# Patient Record
Sex: Male | Born: 1985 | Race: White | Hispanic: No | Marital: Single | Smoking: Former smoker
Health system: Southern US, Community
[De-identification: ages and names within clinical notes are randomized; demographics above are authoritative.]

## PROBLEM LIST (undated history)

## (undated) ENCOUNTER — Emergency Department (HOSPITAL_COMMUNITY): Payer: Self-pay

## (undated) HISTORY — PX: FOOT FUSION: SHX956

---

## 2006-02-23 ENCOUNTER — Emergency Department (HOSPITAL_COMMUNITY): Admission: EM | Admit: 2006-02-23 | Discharge: 2006-02-24 | Payer: Self-pay | Admitting: Emergency Medicine

## 2007-02-28 ENCOUNTER — Emergency Department (HOSPITAL_COMMUNITY): Admission: EM | Admit: 2007-02-28 | Discharge: 2007-03-01 | Payer: Self-pay | Admitting: Emergency Medicine

## 2011-01-04 ENCOUNTER — Emergency Department (HOSPITAL_COMMUNITY)
Admission: EM | Admit: 2011-01-04 | Discharge: 2011-01-04 | Disposition: A | Discharge status: Home / Self Care | Payer: Self-pay | Attending: Emergency Medicine | Admitting: Emergency Medicine

## 2011-01-04 ENCOUNTER — Emergency Department (HOSPITAL_COMMUNITY): Payer: Self-pay

## 2011-01-04 DIAGNOSIS — R071 Chest pain on breathing: Secondary | ICD-10-CM | POA: Insufficient documentation

## 2011-01-04 DIAGNOSIS — K029 Dental caries, unspecified: Secondary | ICD-10-CM | POA: Insufficient documentation

## 2011-01-04 DIAGNOSIS — I10 Essential (primary) hypertension: Secondary | ICD-10-CM | POA: Insufficient documentation

## 2011-01-04 DIAGNOSIS — F411 Generalized anxiety disorder: Secondary | ICD-10-CM | POA: Insufficient documentation

## 2011-01-04 DIAGNOSIS — K089 Disorder of teeth and supporting structures, unspecified: Secondary | ICD-10-CM | POA: Insufficient documentation

## 2011-01-04 LAB — POCT I-STAT, CHEM 8
Calcium, Ion: 1.17 mmol/L (ref 1.12–1.32)
Hemoglobin: 15 g/dL (ref 13.0–17.0)
Potassium: 3.7 mEq/L (ref 3.5–5.1)
Sodium: 141 mEq/L (ref 135–145)
TCO2: 28 mmol/L (ref 0–100)

## 2011-01-04 LAB — POCT CARDIAC MARKERS
Myoglobin, poc: 119 ng/mL (ref 12–200)
Myoglobin, poc: 122 ng/mL (ref 12–200)
Troponin i, poc: <0.05 ng/mL (ref 0.00–0.09)

## 2011-01-04 LAB — CBC
Hemoglobin: 14.7 g/dL (ref 13.0–17.0)
MCH: 31.3 pg (ref 26.0–34.0)
Platelets: 194 10*3/uL (ref 150–400)
RBC: 4.69 MIL/uL (ref 4.22–5.81)
WBC: 11.1 10*3/uL — ABNORMAL HIGH (ref 4.0–10.5)

## 2013-02-27 IMAGING — CR DG CHEST 2V
2 series · 2 of 2 positions shown · non-contrast
Comparison: 02/28/2007

CLINICAL DATA: Chest pain

CHEST - 2 VIEW

[w chest pa]
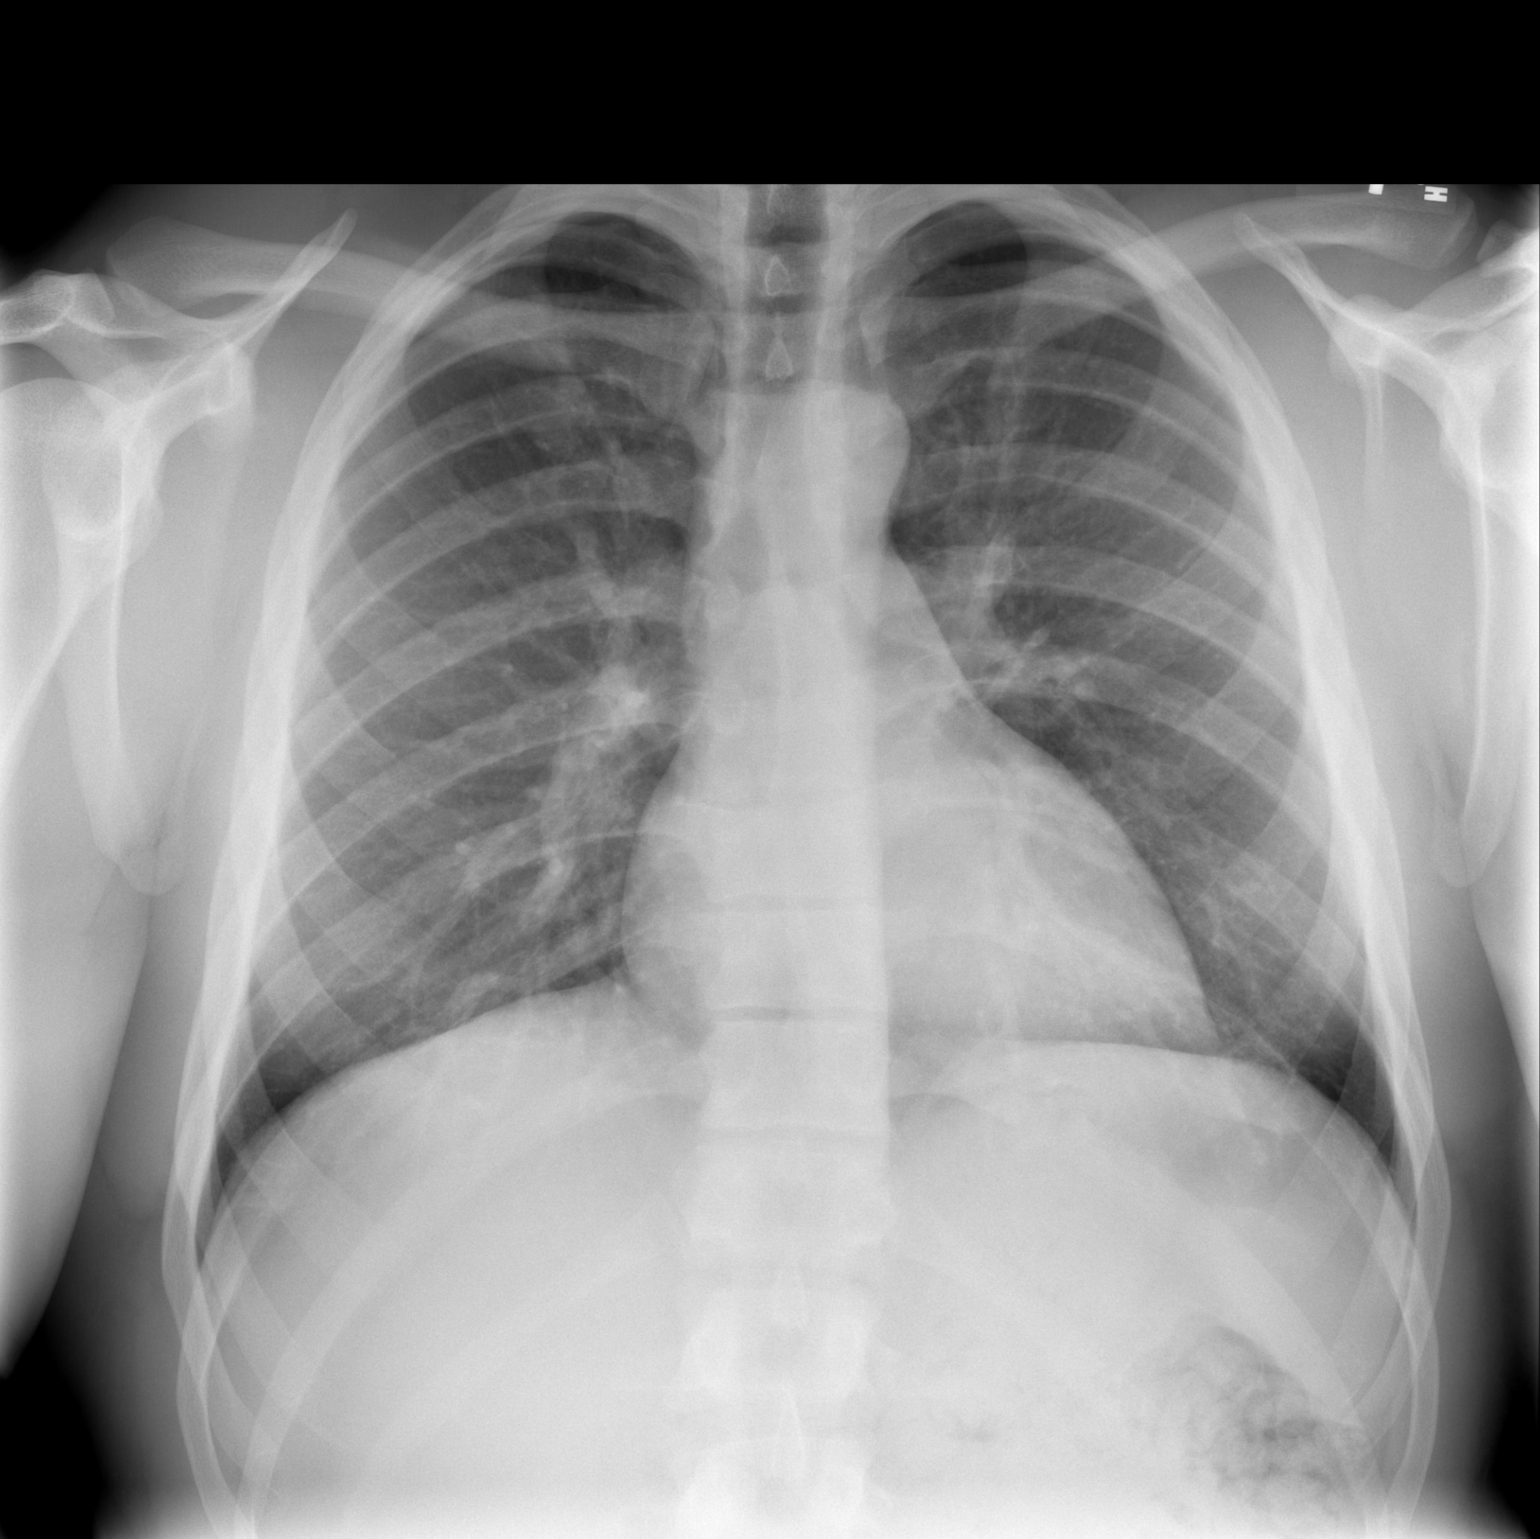

[w chest lat]
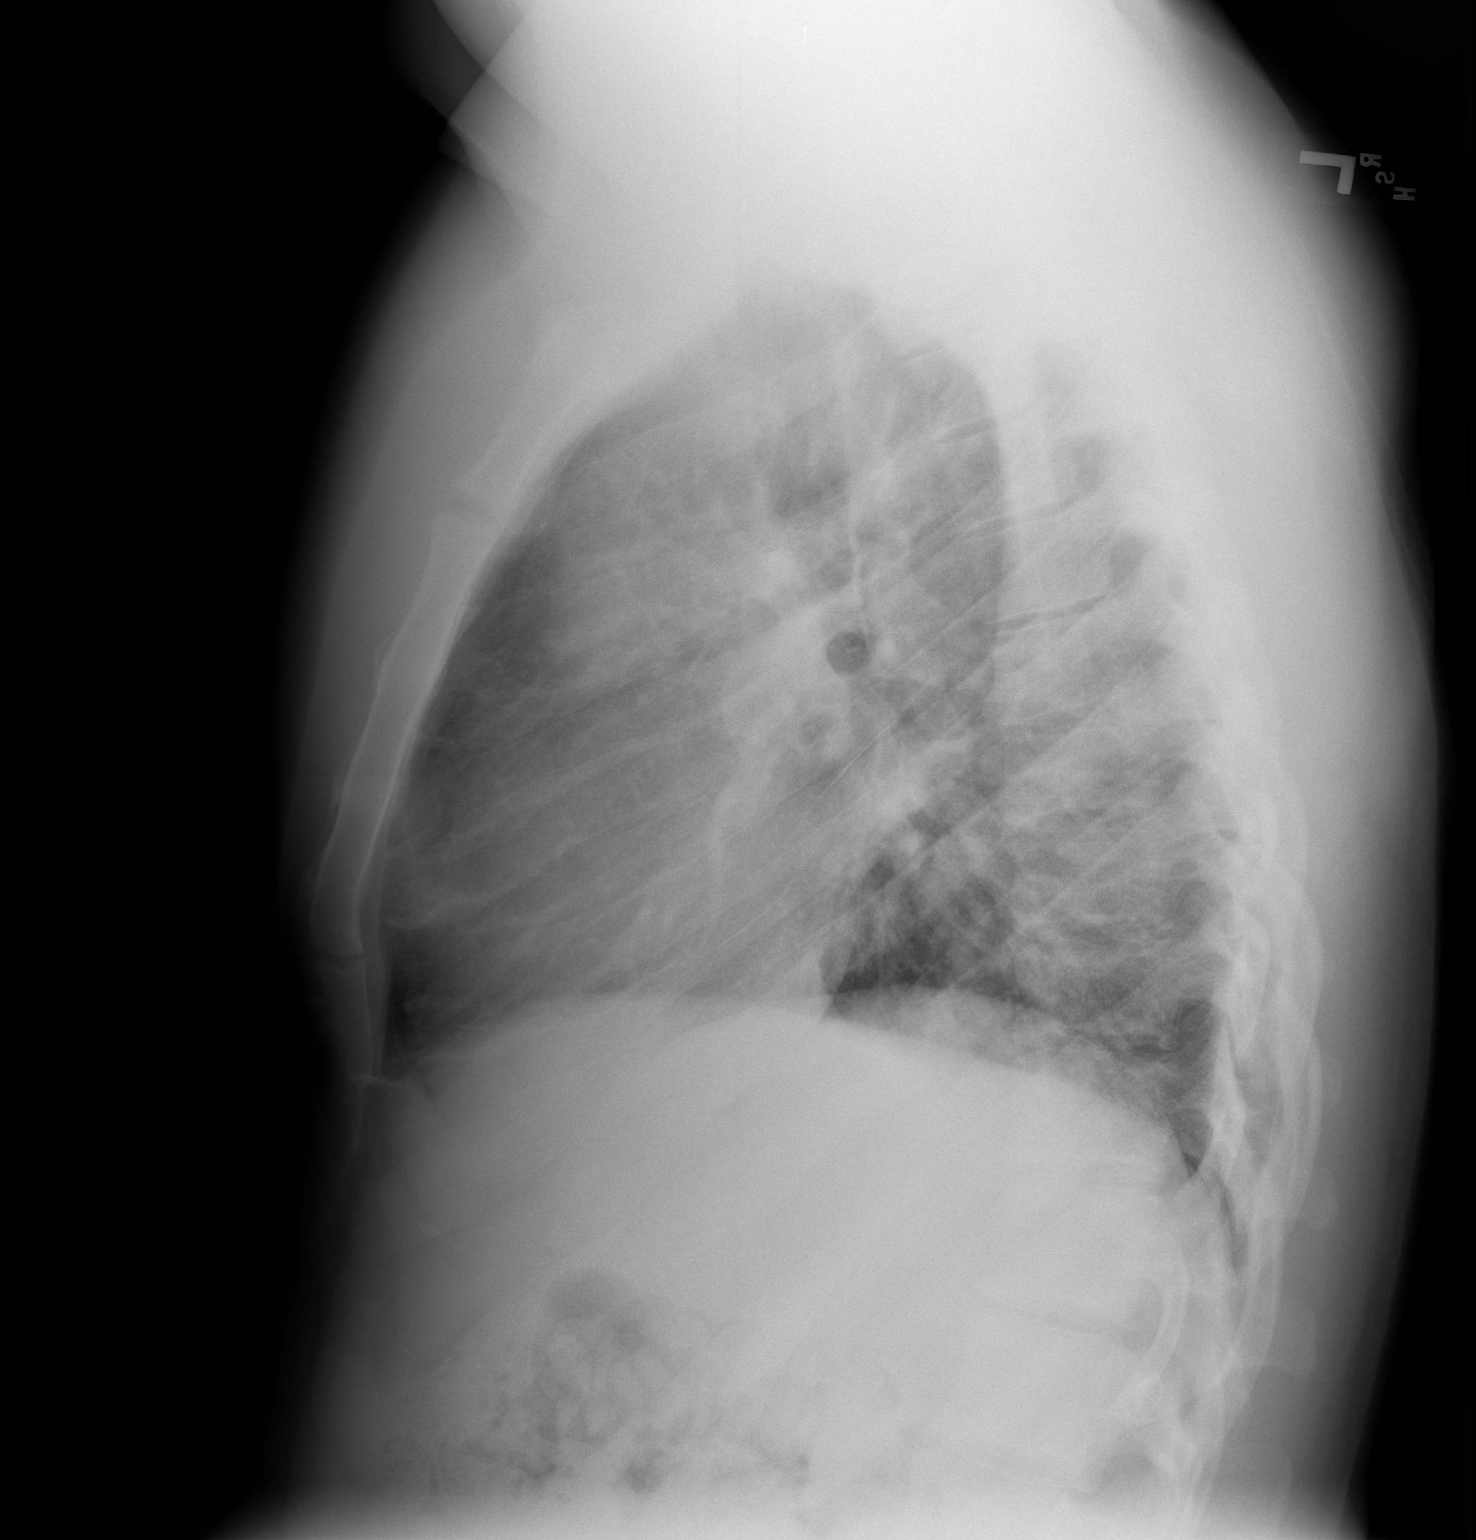

[2 of 2 positions shown; findings below may reference images not displayed]

FINDINGS: Normal heart size.  Clear lungs.
IMPRESSION: Negative.

## 2014-09-03 ENCOUNTER — Emergency Department (HOSPITAL_COMMUNITY)
Admission: EM | Admit: 2014-09-03 | Discharge: 2014-09-03 | Disposition: A | Discharge status: Home / Self Care | Payer: Self-pay

## 2014-09-03 NOTE — ED Notes (Signed)
Pt called to triage but no response from the lobby came.

## 2014-09-03 NOTE — ED Notes (Signed)
Pt called a third time without a response

## 2014-09-03 NOTE — ED Notes (Signed)
Pt called a second time to triage with out response from the lobby

## 2017-08-20 ENCOUNTER — Emergency Department (HOSPITAL_COMMUNITY): Payer: Self-pay

## 2017-08-20 ENCOUNTER — Encounter (HOSPITAL_COMMUNITY): Payer: Self-pay | Admitting: Emergency Medicine

## 2017-08-20 ENCOUNTER — Other Ambulatory Visit: Payer: Self-pay

## 2017-08-20 ENCOUNTER — Emergency Department (HOSPITAL_COMMUNITY)
Admission: EM | Admit: 2017-08-20 | Discharge: 2017-08-20 | Disposition: A | Discharge status: Home / Self Care | Payer: Self-pay | Attending: Physician Assistant | Admitting: Physician Assistant

## 2017-08-20 ENCOUNTER — Emergency Department (HOSPITAL_COMMUNITY): Admission: EM | Admit: 2017-08-20 | Discharge: 2017-08-20 | Discharge status: Against Medical Advice | Payer: Self-pay

## 2017-08-20 DIAGNOSIS — R0981 Nasal congestion: Secondary | ICD-10-CM | POA: Insufficient documentation

## 2017-08-20 DIAGNOSIS — R05 Cough: Secondary | ICD-10-CM | POA: Insufficient documentation

## 2017-08-20 DIAGNOSIS — R509 Fever, unspecified: Secondary | ICD-10-CM | POA: Insufficient documentation

## 2017-08-20 DIAGNOSIS — R112 Nausea with vomiting, unspecified: Secondary | ICD-10-CM | POA: Insufficient documentation

## 2017-08-20 DIAGNOSIS — F419 Anxiety disorder, unspecified: Secondary | ICD-10-CM | POA: Insufficient documentation

## 2017-08-20 DIAGNOSIS — R059 Cough, unspecified: Secondary | ICD-10-CM

## 2017-08-20 DIAGNOSIS — Z87891 Personal history of nicotine dependence: Secondary | ICD-10-CM | POA: Insufficient documentation

## 2017-08-20 LAB — CBC
HCT: 48.2 % (ref 39.0–52.0)
HEMOGLOBIN: 16.2 g/dL (ref 13.0–17.0)
MCH: 30.7 pg (ref 26.0–34.0)
MCHC: 33.6 g/dL (ref 30.0–36.0)
MCV: 91.3 fL (ref 78.0–100.0)
PLATELETS: 269 10*3/uL (ref 150–400)
RBC: 5.28 MIL/uL (ref 4.22–5.81)
RDW: 14.1 % (ref 11.5–15.5)
WBC: 9.1 10*3/uL (ref 4.0–10.5)

## 2017-08-20 LAB — BASIC METABOLIC PANEL
ANION GAP: 5 (ref 5–15)
BUN: 15 mg/dL (ref 6–20)
CALCIUM: 9.3 mg/dL (ref 8.9–10.3)
CO2: 28 mmol/L (ref 22–32)
CREATININE: 1.04 mg/dL (ref 0.61–1.24)
Chloride: 103 mmol/L (ref 101–111)
Glucose, Bld: 79 mg/dL (ref 65–99)
Potassium: 4.6 mmol/L (ref 3.5–5.1)
SODIUM: 136 mmol/L (ref 135–145)

## 2017-08-20 LAB — I-STAT TROPONIN, ED: TROPONIN I, POC: 0 ng/mL (ref 0.00–0.08)

## 2017-08-20 MED ORDER — AZITHROMYCIN 250 MG PO TABS: ORAL_TABLET | ORAL | 0 refills | Status: AC

## 2017-08-20 MED ORDER — AZITHROMYCIN 250 MG PO TABS
500.0000 mg | ORAL_TABLET | Freq: Once | ORAL | Status: AC
  Administered 2017-08-20: 500 mg via ORAL
  Filled 2017-08-20: qty 2

## 2017-08-20 MED ORDER — ALBUTEROL SULFATE HFA 108 (90 BASE) MCG/ACT IN AERS
2.0000 | INHALATION_SPRAY | Freq: Once | RESPIRATORY_TRACT | Status: AC
  Administered 2017-08-20: 2 via RESPIRATORY_TRACT
  Filled 2017-08-20: qty 6.7

## 2017-08-20 NOTE — Discharge Instructions (Signed)
Please take these antibiotics for bronchitis. Please follow up with a primary care physician.

## 2017-08-20 NOTE — ED Triage Notes (Signed)
Reports having central chest pain on and off for over a year.  Also reports having head congestions.  States I think I have pneumonia.  Endorses non productive cough.

## 2017-08-20 NOTE — ED Provider Notes (Signed)
[facility] Provider Note   CSN: [id number] Arrival date & time: 08/20/17  2015     History   Chief Complaint Chief Complaint  Patient presents with  . Chest Pain    HPI Kandice HamsZachary Neuberger is a 31 y.o. male.  HPI   Patient is a 31 year old male presenting with multiple complaints.  He reports he has had sinus congestion for the last 2 months.  Has been using nasal sprays multiple times a day.  He also complains of cough.  And wheezing.  This is been having fever for the last couple days.  He also complains of occasional vomiting.  He states that his he is worried that his hernia could kill him.  He has a nephew that died recently.  He reports that since then he has been having lots of different symptoms.  Patient's girlfriend is worried because she thinks his flatulence smells worse than normal.  Patient admits that he is usually on Xanax, and has been on SSRI in the past for anxiety.   History reviewed. No pertinent past medical history.  There are no active problems to display for this patient.   History reviewed. No pertinent surgical history.     Home Medications    Prior to Admission medications   Medication Sig Start Date End Date Taking? Authorizing Provider  acetaminophen (TYLENOL) 325 MG tablet Take 650 mg every 6 (six) hours as needed by mouth for mild pain or moderate pain.   Yes [provider]    Family History No family history on file.  Social History Social History   Tobacco Use  . Smoking status: Former Games developermoker  . Smokeless tobacco: Never Used  Substance Use Topics  . Alcohol use: No    Frequency: Never  . Drug use: No     Allergies   Patient has no known allergies.   Review of Systems Review of Systems  Constitutional: Positive for fatigue and fever. Negative for activity change.  HENT: Positive for sinus pressure and sinus pain.   Respiratory: Positive for cough and chest tightness.  Negative for shortness of breath.   Cardiovascular: Negative for chest pain.  Gastrointestinal: Positive for nausea and vomiting. Negative for abdominal pain.  Neurological: Positive for light-headedness.  Psychiatric/Behavioral: The patient is nervous/anxious.      Physical Exam Updated Vital Signs BP 131/86 (BP Location: Right Arm)   Pulse 79   Temp 97.9 F (36.6 C) (Oral)   Resp 18   Ht 6\' 3"  (1.905 m)   Wt 122.5 kg (270 lb)   SpO2 96%   BMI 33.75 kg/m   Physical Exam  Constitutional: He is oriented to person, place, and time. He appears well-nourished.  Very muscular 31 year old male.  HENT:  Head: Normocephalic and atraumatic.  Eyes: Conjunctivae and EOM are normal.  Neck: Normal range of motion.  Cardiovascular: Normal rate and normal pulses.  Pulmonary/Chest: Effort normal and breath sounds normal. No tachypnea. No respiratory distress. He has no decreased breath sounds.  Neurological: He is oriented to person, place, and time.  Skin: Skin is warm and dry. He is not diaphoretic.  Psychiatric: He has a normal mood and affect. His behavior is normal.  Nursing note and vitals reviewed.    ED Treatments / Results  Labs (all labs ordered are listed, but only abnormal results are displayed) Labs Reviewed  BASIC METABOLIC PANEL  CBC  I-STAT TROPONIN, ED    EKG  EKG Interpretation None  Radiology Dg Chest 2 View  Result Date: 08/20/2017 CLINICAL DATA:  Central left-sided pain for over a year. Shortness of breath. EXAM: CHEST  2 VIEW COMPARISON:  None. FINDINGS: The heart size and mediastinal contours are within normal limits. Both lungs are clear. The visualized skeletal structures are unremarkable. IMPRESSION: No active cardiopulmonary disease. Electronically Signed   By: Gerome Samavid  Williams III M.D   On: 08/20/2017 20:49    Procedures Procedures (including critical care time)  Medications Ordered in ED Medications  albuterol (PROVENTIL HFA;VENTOLIN  HFA) 108 (90 Base) MCG/ACT inhaler 2 puff (not administered)  azithromycin (ZITHROMAX) tablet 500 mg (not administered)     Initial Impression / Assessment and Plan / ED Course  I have reviewed the triage vital signs and the nursing notes.  Pertinent labs & imaging results that were available during my care of the patient were reviewed by me and considered in my medical decision making (see chart for details).      Patient is a 31 year old male presenting with multiple complaints.  He reports he has had sinus congestion for the last 2 months.  Has been using nasal sprays multiple times a day.  He also complains of cough.  And wheezing.  This is been having fever for the last couple days.  He also complains of occasional vomiting.  He states that his he is worried that his hernia could kill him.  He has a nephew that died recently.  He reports that since then he has been having lots of different symptoms.  Patient's girlfriend is worried because she thinks his flatulence smells worse than normal.  Patient admits that he is usually on Xanax, and has been on SSRI in the past for anxiety.  10:48 PM Will give Z-Pak for patient in case he has bronchitis causing his cough.  Patient's lung sounds are normal.  We will give him albuterol for comfort.  I think majority of his symptoms are coming from anxiety however.  Long discussion about had about how he needs to follow-up with his primary care physician and establish care.  Final Clinical Impressions(s) / ED Diagnoses   Final diagnoses:  None    ED Discharge Orders    None       Abelino DerrickMackuen, [place] Rogacki Lyn, MD 08/20/17 2248

## 2017-10-19 ENCOUNTER — Emergency Department (HOSPITAL_COMMUNITY)
Admission: EM | Admit: 2017-10-19 | Discharge: 2017-10-20 | Discharge status: Against Medical Advice | Payer: Self-pay | Attending: Emergency Medicine | Admitting: Emergency Medicine

## 2017-10-19 ENCOUNTER — Other Ambulatory Visit: Payer: Self-pay

## 2017-10-19 ENCOUNTER — Encounter (HOSPITAL_COMMUNITY): Payer: Self-pay | Admitting: Emergency Medicine

## 2017-10-19 DIAGNOSIS — Z5321 Procedure and treatment not carried out due to patient leaving prior to being seen by health care provider: Secondary | ICD-10-CM | POA: Insufficient documentation

## 2017-10-19 LAB — URINALYSIS, ROUTINE W REFLEX MICROSCOPIC
BILIRUBIN URINE: NEGATIVE
Glucose, UA: NEGATIVE mg/dL
Ketones, ur: NEGATIVE mg/dL
Nitrite: NEGATIVE
Protein, ur: NEGATIVE mg/dL
SPECIFIC GRAVITY, URINE: 1.009 (ref 1.005–1.030)
pH: 6 (ref 5.0–8.0)

## 2017-10-19 LAB — CBC
HEMATOCRIT: 47.7 % (ref 39.0–52.0)
HEMOGLOBIN: 16.5 g/dL (ref 13.0–17.0)
MCH: 31.4 pg (ref 26.0–34.0)
MCHC: 34.6 g/dL (ref 30.0–36.0)
MCV: 90.7 fL (ref 78.0–100.0)
PLATELETS: 267 10*3/uL (ref 150–400)
RBC: 5.26 MIL/uL (ref 4.22–5.81)
RDW: 14.2 % (ref 11.5–15.5)
WBC: 14 10*3/uL — AB (ref 4.0–10.5)

## 2017-10-19 LAB — BASIC METABOLIC PANEL
ANION GAP: 8 (ref 5–15)
BUN: 17 mg/dL (ref 6–20)
CO2: 24 mmol/L (ref 22–32)
Calcium: 9.3 mg/dL (ref 8.9–10.3)
Chloride: 104 mmol/L (ref 101–111)
Creatinine, Ser: 1.13 mg/dL (ref 0.61–1.24)
GLUCOSE: 89 mg/dL (ref 65–99)
POTASSIUM: 4.4 mmol/L (ref 3.5–5.1)
Sodium: 136 mmol/L (ref 135–145)
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
## 2017-10-20 NOTE — ED Notes (Signed)
Pt came to nurses station and stated he did not wish to wait to be seen.  States he has a lot to do tomorrow and even if he got the next room he didn't think he would be willing to wait to be seen.  Ambulatory. In no acute distress at this time.  Pt made aware he can return at any time to be seen if desired.  Verbalized understanding of risk of leaving.

## 2019-03-11 ENCOUNTER — Other Ambulatory Visit: Payer: Self-pay

## 2019-03-11 ENCOUNTER — Emergency Department (HOSPITAL_COMMUNITY)
Admission: EM | Admit: 2019-03-11 | Discharge: 2019-03-11 | Disposition: A | Discharge status: Home / Self Care | Payer: BLUE CROSS/BLUE SHIELD | Attending: Emergency Medicine | Admitting: Emergency Medicine

## 2019-03-11 ENCOUNTER — Encounter (HOSPITAL_COMMUNITY): Payer: Self-pay

## 2019-03-11 DIAGNOSIS — Y999 Unspecified external cause status: Secondary | ICD-10-CM | POA: Insufficient documentation

## 2019-03-11 DIAGNOSIS — Z87891 Personal history of nicotine dependence: Secondary | ICD-10-CM | POA: Diagnosis not present

## 2019-03-11 DIAGNOSIS — S0181XA Laceration without foreign body of other part of head, initial encounter: Secondary | ICD-10-CM

## 2019-03-11 DIAGNOSIS — W01190A Fall on same level from slipping, tripping and stumbling with subsequent striking against furniture, initial encounter: Secondary | ICD-10-CM | POA: Diagnosis not present

## 2019-03-11 DIAGNOSIS — S0993XA Unspecified injury of face, initial encounter: Secondary | ICD-10-CM | POA: Diagnosis present

## 2019-03-11 DIAGNOSIS — S01112A Laceration without foreign body of left eyelid and periocular area, initial encounter: Secondary | ICD-10-CM | POA: Insufficient documentation

## 2019-03-11 DIAGNOSIS — Y929 Unspecified place or not applicable: Secondary | ICD-10-CM | POA: Insufficient documentation

## 2019-03-11 DIAGNOSIS — Y9301 Activity, walking, marching and hiking: Secondary | ICD-10-CM | POA: Insufficient documentation

## 2019-03-11 MED ORDER — LIDOCAINE-EPINEPHRINE (PF) 2 %-1:200000 IJ SOLN
10.0000 mL | Freq: Once | INTRAMUSCULAR | Status: AC
  Administered 2019-03-11: 10 mL
  Filled 2019-03-11: qty 10

## 2019-03-11 MED ORDER — LIDOCAINE-EPINEPHRINE-TETRACAINE (LET) SOLUTION
3.0000 mL | Freq: Once | NASAL | Status: AC
  Administered 2019-03-11: 05:00:00 3 mL via TOPICAL
  Filled 2019-03-11: qty 3

## 2019-03-11 NOTE — Discharge Instructions (Addendum)
Your sutures are absorbable and will not need to be removed.   Ice will help with swelling. Take Aleve for pain. Return here with any new or concerning symptoms.

## 2019-03-11 NOTE — ED Notes (Addendum)
Priscille Loveless [phone]

## 2019-03-11 NOTE — ED Triage Notes (Addendum)
Pt reports laceration above his L eye. States that he tripped and hit his head on the countertop PTA. Bleeding controlled with pressure. Denies LOC.

## 2019-03-11 NOTE — ED Provider Notes (Signed)
[facility] DEPT Provider Note   CSN: [id number] Arrival date & time: 03/11/19  0405    History   Chief Complaint Chief Complaint  Patient presents with  . Laceration    HPI Roy Daniels is a 33 y.o. male.     Patient to ED after he tripped while walking and hit a piece of furniture causing left eyebrow laceration. No LOC, eye pain, visual change or neck pain. No nausea or vomiting.   The history is provided by the patient. No language interpreter was used.  Laceration  Location:  Face Facial laceration location:  L eyebrow Depth:  Through dermis Quality: jagged   Bleeding: venous and controlled with pressure   Time since incident:  1 hour Laceration mechanism:  Fall   History reviewed. No pertinent past medical history.  There are no active problems to display for this patient.   Past Surgical History:  Procedure Laterality Date  . FOOT FUSION          Home Medications    Prior to Admission medications   Medication Sig Start Date End Date Taking? Authorizing Provider  acetaminophen (TYLENOL) 325 MG tablet Take 650 mg every 6 (six) hours as needed by mouth for mild pain or moderate pain.    [provider]  azithromycin (ZITHROMAX Z-PAK) 250 MG tablet One pill daily for 4 days. 08/20/17   Mackuen, Fredia Sorrow, MD    Family History History reviewed. No pertinent family history.  Social History Social History   Tobacco Use  . Smoking status: Former Research scientist (life sciences)  . Smokeless tobacco: Never Used  Substance Use Topics  . Alcohol use: No    Frequency: Never  . Drug use: No     Allergies   Patient has no known allergies.   Review of Systems Review of Systems  Eyes: Negative for pain and visual disturbance.  Gastrointestinal: Negative for nausea and vomiting.  Musculoskeletal: Negative for neck pain.  Skin: Positive for wound.  Neurological: Negative for syncope and headaches.     Physical Exam Updated  Vital Signs BP 134/64 (BP Location: Right Arm)   Pulse 67   Temp 98 F (36.7 C) (Oral)   Resp 14   SpO2 97%   Physical Exam Constitutional:      Appearance: He is well-developed.  Eyes:     Extraocular Movements: Extraocular movements intact.     Comments: Full pain-free ROM.  Neck:     Musculoskeletal: Normal range of motion.  Pulmonary:     Effort: Pulmonary effort is normal.  Musculoskeletal: Normal range of motion.     Comments: No midline cervical tenderness.   Skin:    General: Skin is warm and dry.     Comments: 2 cm laceration to left eyebrow. Linear with jagged, abraded edges.   Neurological:     Mental Status: He is alert and oriented to person, place, and time.     Coordination: Coordination normal.     Gait: Gait normal.      ED Treatments / Results  Labs (all labs ordered are listed, but only abnormal results are displayed) Labs Reviewed - No data to display  EKG None  Radiology No results found.  Procedures .Marland KitchenLaceration Repair Date/Time: 03/11/2019 6:17 AM Performed by: Charlann Lange, PA-C Authorized by: Charlann Lange, PA-C   Consent:    Consent obtained:  Verbal   Consent given by:  Patient Anesthesia (see MAR for exact dosages):    Anesthesia method:  Topical  application and local infiltration   Topical anesthetic:  LET   Local anesthetic:  Lidocaine 1% WITH epi Laceration details:    Location:  Face   Face location:  L eyebrow   Length (cm):  2 Repair type:    Repair type:  Simple Pre-procedure details:    Preparation:  Patient was prepped and draped in usual sterile fashion Exploration:    Hemostasis achieved with:  Direct pressure   Wound extent: no foreign bodies/material noted     Contaminated: no   Treatment:    Area cleansed with:  Saline   Amount of cleaning:  Standard   Irrigation solution:  Sterile saline Skin repair:    Repair method:  Sutures   Suture size:  5-0   Suture material:  Plain gut   Number of sutures:  5  Approximation:    Approximation:  Close Post-procedure details:    Dressing:  Open (no dressing)   Patient tolerance of procedure:  Tolerated well, no immediate complications   (including critical care time)  Medications Ordered in ED Medications  lidocaine-EPINEPHrine-tetracaine (LET) solution (3 mLs Topical Given 03/11/19 0458)  lidocaine-EPINEPHrine (XYLOCAINE W/EPI) 2 %-1:200000 (PF) injection 10 mL (10 mLs Infiltration Given 03/11/19 0546)     Initial Impression / Assessment and Plan / ED Course  I have reviewed the triage vital signs and the nursing notes.  Pertinent labs & imaging results that were available during my care of the patient were reviewed by me and considered in my medical decision making (see chart for details).        Patient to ED with simple facial laceration after fall tonight. No LOC, eye injury or neck pain.   Laceration repaired as per above note. Stable for discharge.   Final Clinical Impressions(s) / ED Diagnoses   Final diagnoses:  None   1. Facial laceration  ED Discharge Orders    None       Elpidio AnisUpstill, Tanaja Ganger, PA-C 03/11/19 [id number]    Shon BatonHorton, Courtney F, MD 03/11/19 [phone]

## 2019-11-14 ENCOUNTER — Other Ambulatory Visit (HOSPITAL_COMMUNITY): Payer: Self-pay | Admitting: Physician Assistant

## 2019-11-14 ENCOUNTER — Ambulatory Visit (HOSPITAL_COMMUNITY)
Admission: RE | Admit: 2019-11-14 | Discharge: 2019-11-14 | Disposition: A | Discharge status: Home / Self Care | Payer: BLUE CROSS/BLUE SHIELD | Source: Ambulatory Visit | Attending: Physician Assistant | Admitting: Physician Assistant

## 2019-11-14 ENCOUNTER — Other Ambulatory Visit: Payer: Self-pay

## 2019-11-14 DIAGNOSIS — R05 Cough: Secondary | ICD-10-CM | POA: Diagnosis present

## 2019-11-14 DIAGNOSIS — R0602 Shortness of breath: Secondary | ICD-10-CM | POA: Diagnosis present

## 2019-11-14 DIAGNOSIS — J40 Bronchitis, not specified as acute or chronic: Secondary | ICD-10-CM

## 2019-11-14 DIAGNOSIS — R059 Cough, unspecified: Secondary | ICD-10-CM

## 2022-01-07 IMAGING — CR DG CHEST 2V
2 series · 2 of 2 positions shown · non-contrast
Comparison: 08/20/2017

CLINICAL DATA: Bronchitis and shortness of breath

EXAM:
CHEST - 2 VIEW

[chest pa]
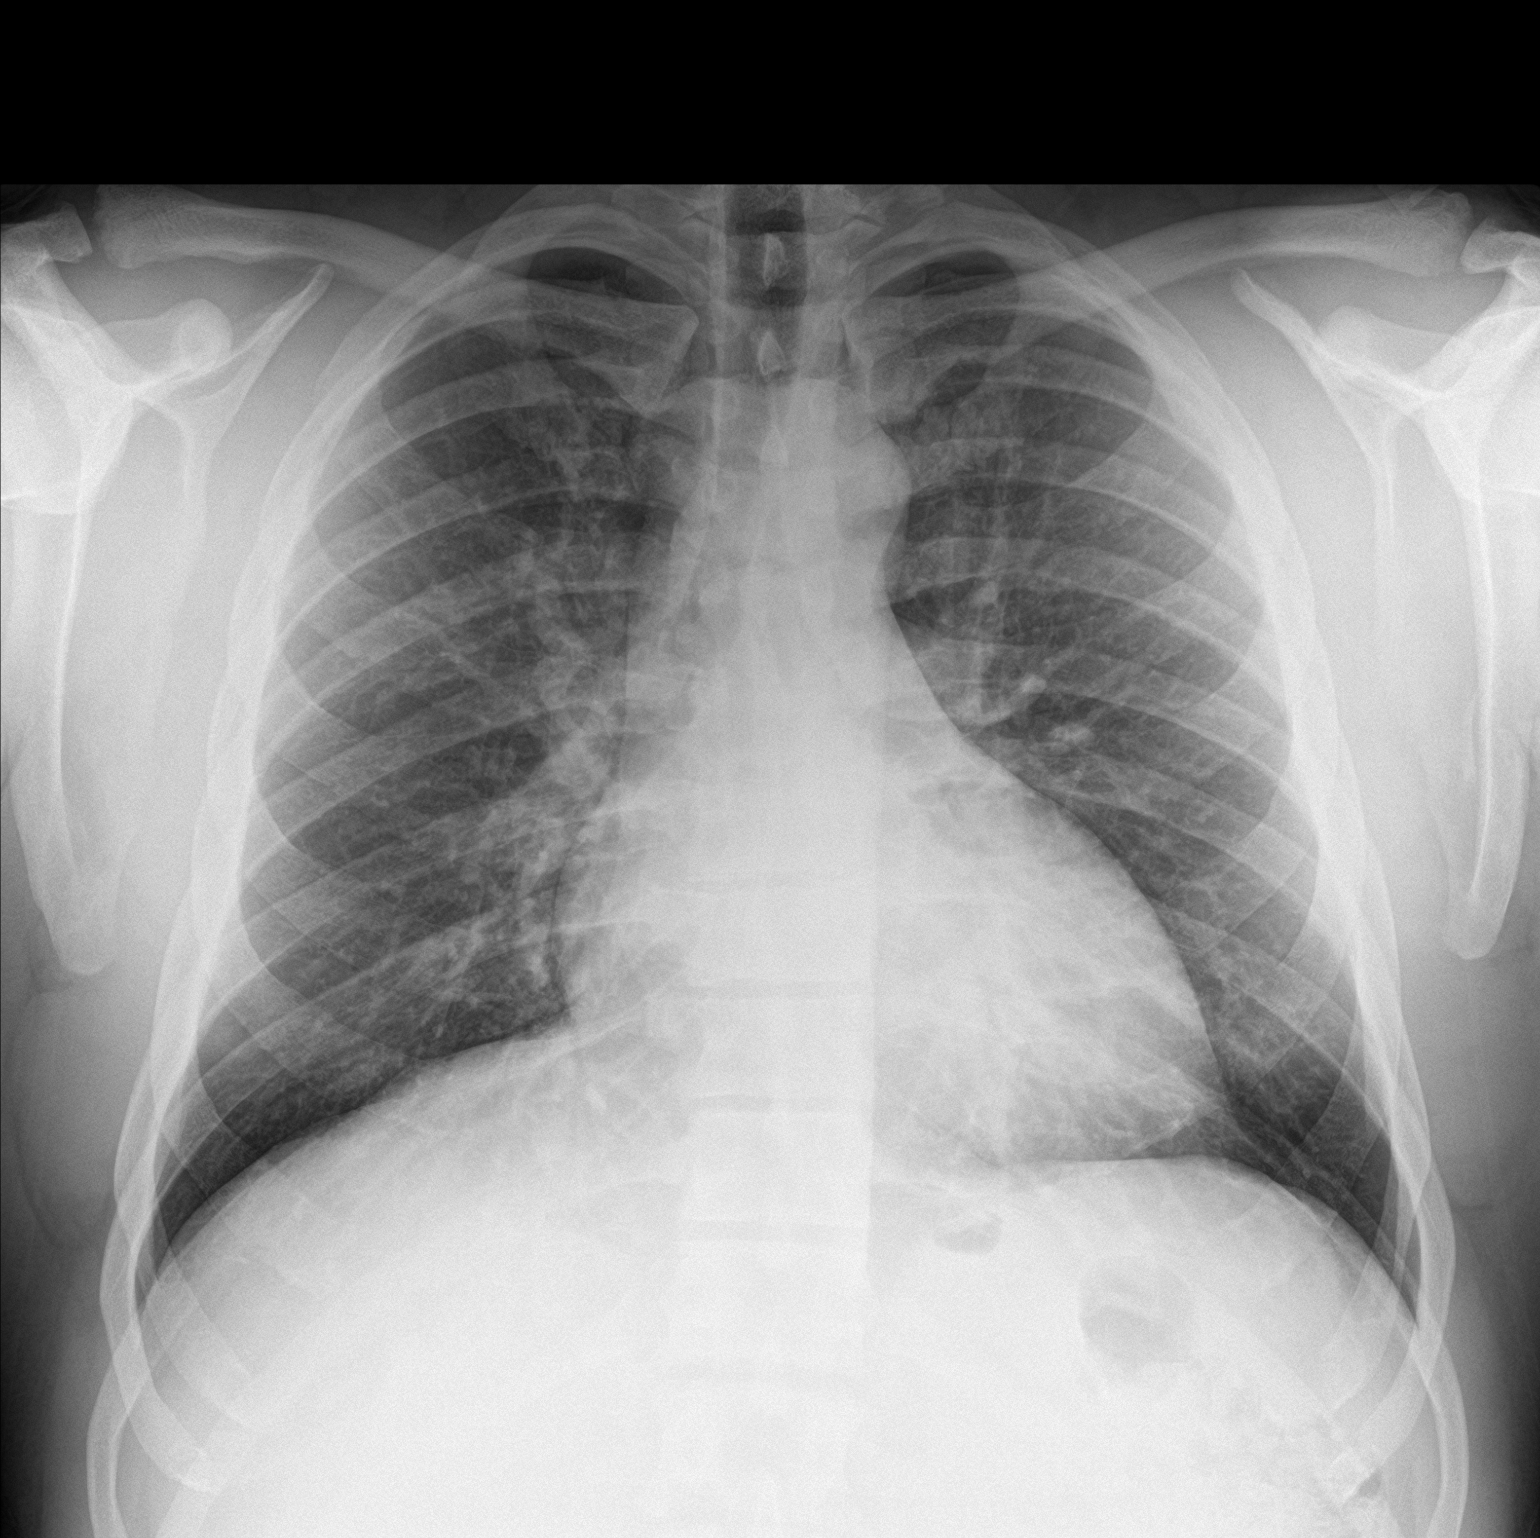

[chest lat]
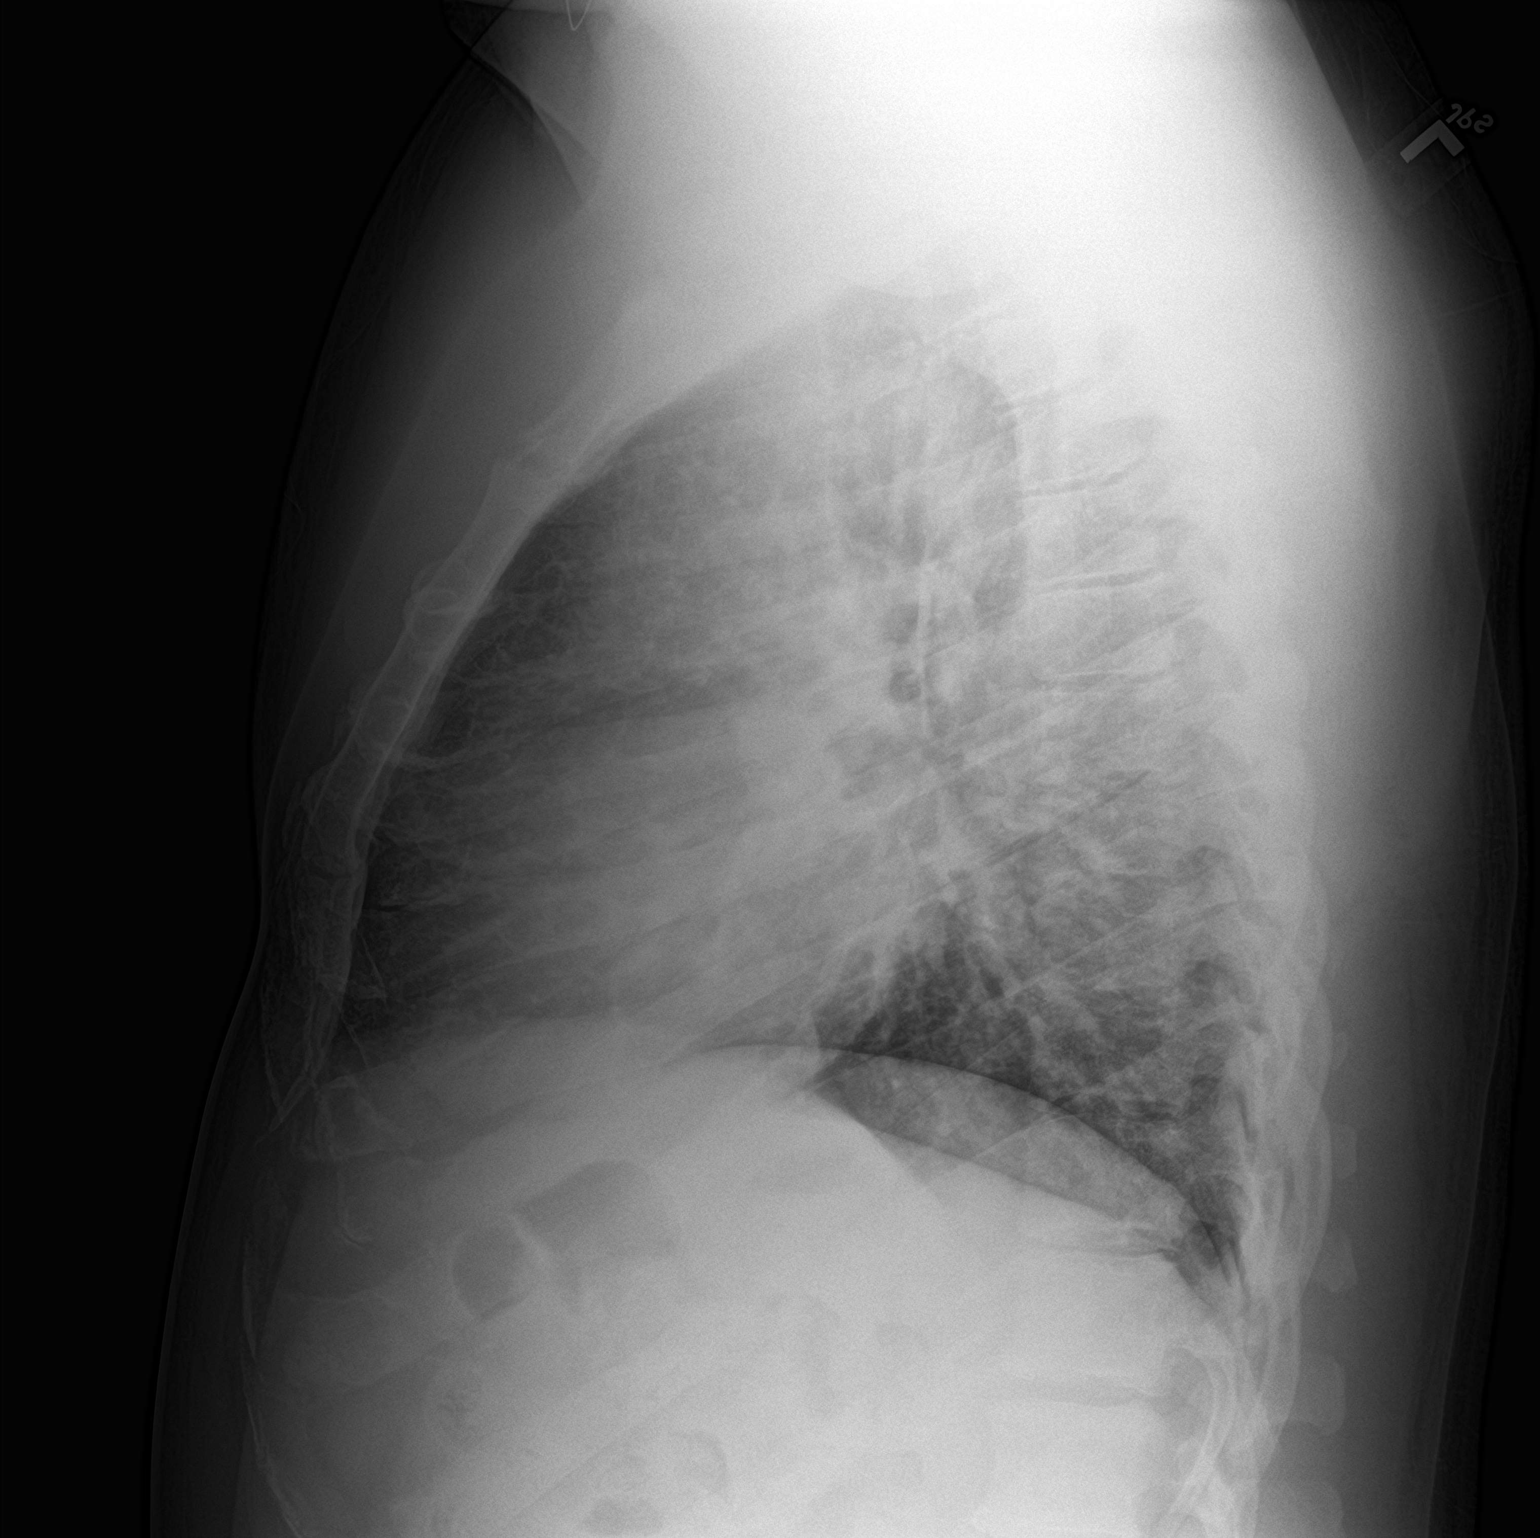

[2 of 2 positions shown; findings below may reference images not displayed]

FINDINGS: Cardiac shadows within normal limits. The lungs are well aerated
bilaterally. Slight increased bronchitic markings are noted
consistent with the given clinical history. No sizable effusion is
seen. No bony abnormality is noted.
IMPRESSION: Mild increase in bronchitic markings consistent with the given
clinical history.

## 2022-12-08 ENCOUNTER — Other Ambulatory Visit (HOSPITAL_BASED_OUTPATIENT_CLINIC_OR_DEPARTMENT_OTHER): Payer: Self-pay

## 2024-05-08 ENCOUNTER — Other Ambulatory Visit (HOSPITAL_BASED_OUTPATIENT_CLINIC_OR_DEPARTMENT_OTHER): Payer: Self-pay
